# Patient Record
Sex: Male | Born: 1995 | Race: White | Hispanic: No | Marital: Married | Smoking: Current every day smoker
Health system: Southern US, Community
[De-identification: ages and names within clinical notes are randomized; demographics above are authoritative.]

## PROBLEM LIST (undated history)

## (undated) DIAGNOSIS — G723 Periodic paralysis: Secondary | ICD-10-CM

---

## 2017-08-30 ENCOUNTER — Other Ambulatory Visit: Payer: Self-pay

## 2017-08-30 ENCOUNTER — Emergency Department (HOSPITAL_BASED_OUTPATIENT_CLINIC_OR_DEPARTMENT_OTHER): Payer: 59

## 2017-08-30 ENCOUNTER — Encounter (HOSPITAL_BASED_OUTPATIENT_CLINIC_OR_DEPARTMENT_OTHER): Payer: Self-pay

## 2017-08-30 ENCOUNTER — Emergency Department (HOSPITAL_BASED_OUTPATIENT_CLINIC_OR_DEPARTMENT_OTHER)
Admission: EM | Admit: 2017-08-30 | Discharge: 2017-08-30 | Disposition: A | Discharge status: Home / Self Care | Payer: 59 | Attending: Emergency Medicine | Admitting: Emergency Medicine

## 2017-08-30 DIAGNOSIS — R9431 Abnormal electrocardiogram [ECG] [EKG]: Secondary | ICD-10-CM

## 2017-08-30 DIAGNOSIS — E876 Hypokalemia: Secondary | ICD-10-CM | POA: Diagnosis not present

## 2017-08-30 DIAGNOSIS — F17290 Nicotine dependence, other tobacco product, uncomplicated: Secondary | ICD-10-CM | POA: Diagnosis not present

## 2017-08-30 DIAGNOSIS — G723 Periodic paralysis: Secondary | ICD-10-CM | POA: Insufficient documentation

## 2017-08-30 DIAGNOSIS — I493 Ventricular premature depolarization: Secondary | ICD-10-CM

## 2017-08-30 DIAGNOSIS — R002 Palpitations: Secondary | ICD-10-CM

## 2017-08-30 DIAGNOSIS — Z720 Tobacco use: Secondary | ICD-10-CM

## 2017-08-30 DIAGNOSIS — R0789 Other chest pain: Secondary | ICD-10-CM | POA: Diagnosis not present

## 2017-08-30 HISTORY — DX: Periodic paralysis: G72.3

## 2017-08-30 LAB — CBC
HCT: 44 % (ref 39.0–52.0)
Hemoglobin: 15.8 g/dL (ref 13.0–17.0)
MCH: 32.2 pg (ref 26.0–34.0)
MCHC: 35.9 g/dL (ref 30.0–36.0)
MCV: 89.6 fL (ref 78.0–100.0)
Platelets: 232 10*3/uL (ref 150–400)
RBC: 4.91 MIL/uL (ref 4.22–5.81)
RDW: 12.5 % (ref 11.5–15.5)
WBC: 10.2 10*3/uL (ref 4.0–10.5)

## 2017-08-30 LAB — BASIC METABOLIC PANEL
ANION GAP: 7 (ref 5–15)
BUN: 14 mg/dL (ref 6–20)
CALCIUM: 9.6 mg/dL (ref 8.9–10.3)
CHLORIDE: 106 mmol/L (ref 98–111)
CO2: 27 mmol/L (ref 22–32)
Creatinine, Ser: 0.85 mg/dL (ref 0.61–1.24)
GFR calc Af Amer: >60 mL/min (ref >60)
Glucose, Bld: 109 mg/dL — ABNORMAL HIGH (ref 70–99)
POTASSIUM: 3.2 mmol/L — AB (ref 3.5–5.1)
SODIUM: 140 mmol/L (ref 135–145)

## 2017-08-30 LAB — TSH: TSH: 1.07 u[IU]/mL (ref 0.350–4.500)

## 2017-08-30 LAB — MAGNESIUM: Magnesium: 1.9 mg/dL (ref 1.7–2.4)

## 2017-08-30 LAB — TROPONIN I: Troponin I: <0.03 ng/mL (ref <0.03)

## 2017-08-30 LAB — T4, FREE: Free T4: 0.86 ng/dL (ref 0.82–1.77)

## 2017-08-30 MED ORDER — POTASSIUM CHLORIDE CRYS ER 20 MEQ PO TBCR
60.0000 meq | EXTENDED_RELEASE_TABLET | Freq: Once | ORAL | Status: AC
  Administered 2017-08-30: 60 meq via ORAL
  Filled 2017-08-30: qty 3

## 2017-08-30 NOTE — Discharge Instructions (Addendum)
STOP VAPING! Avoid caffeine intake. Avoid doing any activities that make your symptoms worse, but you are allowed to go about your regular activities as long as they don't trigger your symptoms or make you feel bad/worse. Your potassium was low, which is expected with your condition; see the list of foods below to add in your diet to help supplement your diet with potassium. Stay very well hydrated and get plenty of rest. You MUST find a primary care doctor as soon as possible, for ongoing medical care and to potentially get a holter monitor study done in the meantime until you can see the cardiologist. Follow up with the cardiologist listed above in the next few weeks (as soon as you can get an appointment) for ongoing cardiac evaluation and care. [facility] for emergent changes or worsening symptoms.

## 2017-08-30 NOTE — ED Triage Notes (Signed)
C/o chest tightness started last night-NAD-steady gait 

## 2017-08-30 NOTE — ED Provider Notes (Signed)
[facility] EMERGENCY DEPARTMENT Provider Note   CSN: [id number] Arrival date & time: 08/30/17  1259     History   Chief Complaint Chief Complaint  Patient presents with  . Chest Pain    HPI Brian Barnes is a 22 y.o. male with a PMHx of hypokalemic periodic paralysis/Andersen-Tawil Syndrome, who presents to the ED with complaints of intermittent chest tightness, fatigue, and palpitations for the last 1-2 days.  Patient states that when he is resting and not distracted he notices that his heart feels like it is skipping around, does not feel like it is too fast just feels like in a regular beat.  He has checked it and his heart rate will vary anywhere from 40-100, never anything over 100 while he's checking it.  He is also noticed that he has chest tightness during these episodes, and will feel fatigued.  He denies chest pain, just states it feels like a tightness.  He states that being at rest and not distracted seems to make his symptoms worse, and he wonders whether it is "all mental" due to the fact that he is "paying attention to it".  He states that being distracted and doing something such as standing or doing activities seems to improve his symptoms, but again he thinks it is because he is distracted and not paying attention to it.  No other known aggravating or alleviating factors.  He states that occasionally he will feel "a little bit" lightheaded but states that it is "not much" and feels "less lightheaded than when you get up very fast".  He also states that occasionally he will feel "a little bit" short of breath but again he states that this is "not much".  Due to his unique condition, Andersen-Tawil syndrome, he has been remotely evaluated by a cardiologist in the past, can't recall the name of the doctor but states [facility] and it was about 5624yrs ago; hasn't seen a cardiologist since then.  States that at the time he was told his EKG looked "good and normal".  He  does not have a PCP at this time.  His siblings both have the same condition he has, and his brother has "some heart stuff related to that", however he denies any known FHx of MI/CAD or other cardiac disease.  He admits to using E-cigarettes which contain nicotine.  He denies having any other medical problems that he's aware of.   He denies diaphoresis, fevers, chills, cough, LE swelling, recent travel/surgery/immobilization, personal/family hx of DVT/PE, abd pain, N/V/D/C, hematuria, dysuria, myalgias, arthralgias, claudication, orthopnea, numbness, tingling, focal weakness, or any other complaints at this time.  The history is provided by the patient and medical records. No language interpreter was used.  Chest Pain   Associated symptoms include palpitations and shortness of breath ("a little bit sometimes but not much"). Pertinent negatives include no abdominal pain, no cough, no diaphoresis, no fever, no nausea, no numbness, no vomiting and no weakness.    Past Medical History:  Diagnosis Date  . Hypokalemic periodic paralysis     There are no active problems to display for this patient.   History reviewed. No pertinent surgical history.      Home Medications    Prior to Admission medications   Not on File    Family History No family history on file.  Social History Social History   Tobacco Use  . Smoking status: Current Every Day Smoker    Types: E-cigarettes  .  Smokeless tobacco: Former Engineer, water Use Topics  . Alcohol use: Not Currently  . Drug use: Yes    Types: Marijuana     Allergies   Patient has no known allergies.   Review of Systems Review of Systems  Constitutional: Negative for chills, diaphoresis and fever.  Respiratory: Positive for chest tightness and shortness of breath ("a little bit sometimes but not much"). Negative for cough.   Cardiovascular: Positive for chest pain (tightness) and palpitations. Negative for leg swelling.    Gastrointestinal: Negative for abdominal pain, constipation, diarrhea, nausea and vomiting.  Genitourinary: Negative for dysuria and hematuria.  Musculoskeletal: Negative for arthralgias and myalgias.  Skin: Negative for color change.  Allergic/Immunologic: Negative for immunocompromised state.  Neurological: Positive for light-headedness ("a little bit sometimes but not much"). Negative for weakness and numbness.  Psychiatric/Behavioral: Negative for confusion.   All other systems reviewed and are negative for acute change except as noted in the HPI.    Physical Exam Updated Vital Signs BP 130/75   Pulse 79   Temp 98.4 F (36.9 C) (Oral)   Resp 16   Ht 6' (1.829 m)   Wt 80.7 kg (178 lb)   SpO2 100%   BMI 24.14 kg/m    Physical Exam  Constitutional: He is oriented to person, place, and time. Vital signs are normal. He appears well-developed and well-nourished.  Non-toxic appearance. No distress.  Afebrile, nontoxic, NAD  HENT:  Head: Normocephalic and atraumatic.  Mouth/Throat: Oropharynx is clear and moist and mucous membranes are normal.  Eyes: Conjunctivae and EOM are normal. Right eye exhibits no discharge. Left eye exhibits no discharge.  Neck: Normal range of motion. Neck supple.  Cardiovascular: Normal rate, regular rhythm, normal heart sounds and intact distal pulses. Exam reveals no gallop and no friction rub.  No murmur heard. During exam, RRR, nl s1/s2, no m/r/g; on the monitor, he's in NSR for the majority of the evaluation however at one point he has a few PVCs for about 30 seconds and then goes back to NSR. Distal pulses intact, no pedal edema   Pulmonary/Chest: Effort normal and breath sounds normal. No respiratory distress. He has no decreased breath sounds. He has no wheezes. He has no rhonchi. He has no rales. He exhibits no tenderness, no crepitus, no deformity and no retraction.  CTAB in all lung fields, no w/r/r, no hypoxia or increased WOB, speaking in full  sentences, SpO2 100% on RA  Chest wall nonTTP without crepitus, deformities, or retractions   Abdominal: Soft. Normal appearance and bowel sounds are normal. He exhibits no distension. There is no tenderness. There is no rigidity, no rebound, no guarding, no CVA tenderness, no tenderness at McBurney's point and negative Murphy's sign.  Musculoskeletal: Normal range of motion.  MAE x4 Strength and sensation grossly intact in all extremities Distal pulses intact Gait steady No pedal edema, neg homan's bilaterally   Neurological: He is alert and oriented to person, place, and time. He has normal strength. No sensory deficit.  Skin: Skin is warm, dry and intact. No rash noted.  Psychiatric: He has a normal mood and affect.  Nursing note and vitals reviewed.    ED Treatments / Results  Labs (all labs ordered are listed, but only abnormal results are displayed) Labs Reviewed  BASIC METABOLIC PANEL - Abnormal; Notable for the following components:      Result Value   Potassium 3.2 (*)    Glucose, Bld 109 (*)    All  other components within normal limits  CBC  TROPONIN I  MAGNESIUM  TSH  T4, FREE    EKG EKG Interpretation  Date/Time:  Tuesday August 30 2017 13:48:16 EDT Ventricular Rate:  80 PR Interval:  122 QRS Duration: 130 QT Interval:  385 QTC Calculation: 445 R Axis:   61 Text Interpretation:  Sinus rhythm Borderline short PR interval Right bundle branch block Confirmed by Tilden Fossa [phone]) on 08/30/2017 2:07:55 PM  EKG Interpretation- INITIAL EKG Date/Time:  Tuesday August 30 2017 13:09:38 EDT Ventricular Rate:  119 PR Interval:  122 QRS Duration: 118 QT Interval:  392 QTC Calculation: 551 R Axis:   71 Text Interpretation:  Critical Test Result: Long QTc Sinus bradycardia multiform PVC Incomplete right bundle branch block Borderline ECG Confirmed by Tilden Fossa [phone]) on 08/30/2017 2:07:16 PM  Radiology Dg Chest 2 View  Result Date: 08/30/2017 CLINICAL DATA:   Chest tightness since last night. EXAM: CHEST - 2 VIEW COMPARISON:  None. FINDINGS: Lungs clear. Heart size normal. No pneumothorax or pleural fluid. No bony abnormality. IMPRESSION: Normal chest. Electronically Signed   By: Drusilla Kanner M.D.   On: 08/30/2017 13:25    Procedures Procedures (including critical care time)  Medications Ordered in ED Medications  potassium chloride SA (K-DUR,KLOR-CON) CR tablet 60 mEq (60 mEq Oral Given 08/30/17 1605)     Initial Impression / Assessment and Plan / ED Course  I have reviewed the triage vital signs and the nursing notes.  Pertinent labs & imaging results that were available during my care of the patient were reviewed by me and considered in my medical decision making (see chart for details).     22 y.o. male here with intermittent chest tightness, fatigue, and palpitations x1-2 days; reports some very mild lightheadedness and SOB at times but nothing really significant. On exam, clear lungs, RRR without m/r/g, distal pulses intact symmetrically, no pedal edema, no tachycardia or hypoxia, no chest wall TTP, well appearing. EKG with multiform PVCs, ?sinus brady vs sinus rhythm, long QTc, and incomplete RBBB, however interestingly while in room the monitor showing NSR for several minutes and then will have run of PVCs vs ?bigeminy before going back to NSR again. CXR negative for any acute findings. Doubt PE or dissection, doubt need for D-dimer. Will get labs including Mg level and TSH/T4 and reassess shortly. Discussed case with my attending Dr. Madilyn Hook who agrees with plan.   3:48 PM CBC WNL. BMP with mildly low K 3.2, will replete this here; otherwise remainder of BMP WNL. Mg level WNL. Trop negative. TSH/T4 pending. Pt continues to remain stable, states that currently his symptoms feel improved (says that the saline flush after starting his IV seemed to help).  Will touch base with cardiology to discuss case, given pt's history of Andersen-Tawil  syndrome and his abnormal EKG. Will reassess shortly.   4:04 PM Dr. Cristal Deer of cardiology returning page, reports that his EKG looks like bigeminy/multifocal PVCs; recommends giving potassium, f/up cardiology outpatient soon, doesn't think he needs to be admitted just needs to be seen ASAP; if he's miserable then xfer to ED for eval there, if not then can go home with close f/up with them. Wants him to get PCP ASAP so they can get holter monitor study started, and to have medical care, and then f/up with cardiology in the next few weeks as soon as possible. If symptoms progress then come back immediately for cardiac eval. Continue daily activities, avoid caffeine intake and vaping.  Pt feeling well and agreeable with outpatient evaluation/management, doesn't feel like he needs to go to the ED at this time for urgent evaluation; I think this is reasonable, pt remained stable. Advised staying hydrated, getting plenty of rest, given list of foods with potassium to help supplement diet, Nicotine/vaping cessation strongly encouraged, get PCP ASAP, and f/up with cardiology ASAP. Strict return precautions advised. I explained the diagnosis and have given explicit precautions to return to the ER including for any other new or worsening symptoms. The patient understands and accepts the medical plan as it's been dictated and I have answered their questions. Discharge instructions concerning home care and prescriptions have been given. The patient is STABLE and is discharged to home in good condition.    Final Clinical Impressions(s) / ED Diagnoses   Final diagnoses:  Chest tightness  Abnormal EKG  PVC's (premature ventricular contractions)  Palpitations  Current nicotine use  Hypokalemia    ED Discharge Orders    [address] [place], [place] 08/30/17 1626    Tilden Fossa, MD 08/31/17 [phone]

## 2017-09-22 ENCOUNTER — Ambulatory Visit: Payer: PRIVATE HEALTH INSURANCE | Admitting: Cardiology

## 2018-12-05 IMAGING — DX DG CHEST 2V
2 series · 2 of 2 positions shown · non-contrast
Comparison: None.

CLINICAL DATA: Chest tightness since last night.

EXAM:
CHEST - 2 VIEW

[chest pa]
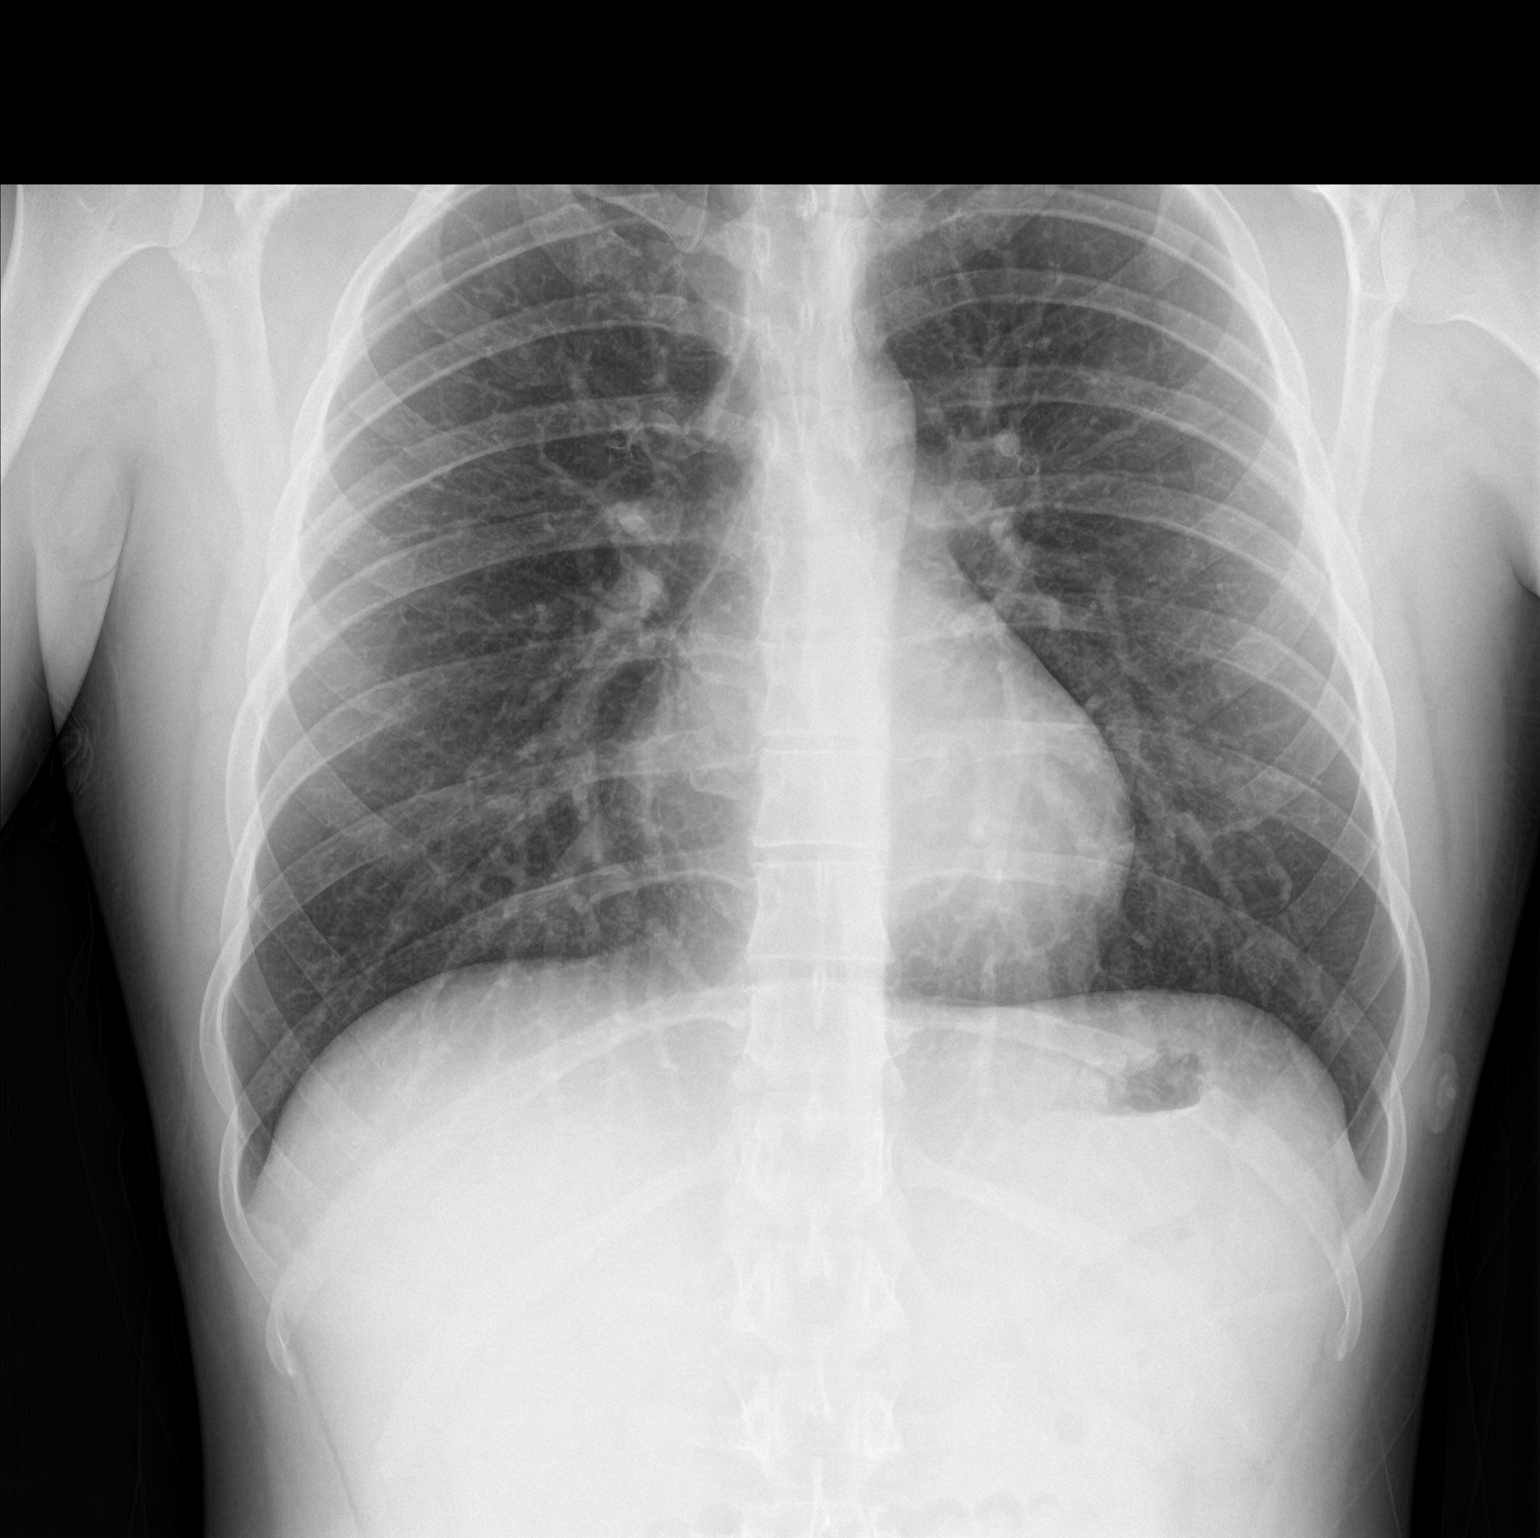

[chest lat]
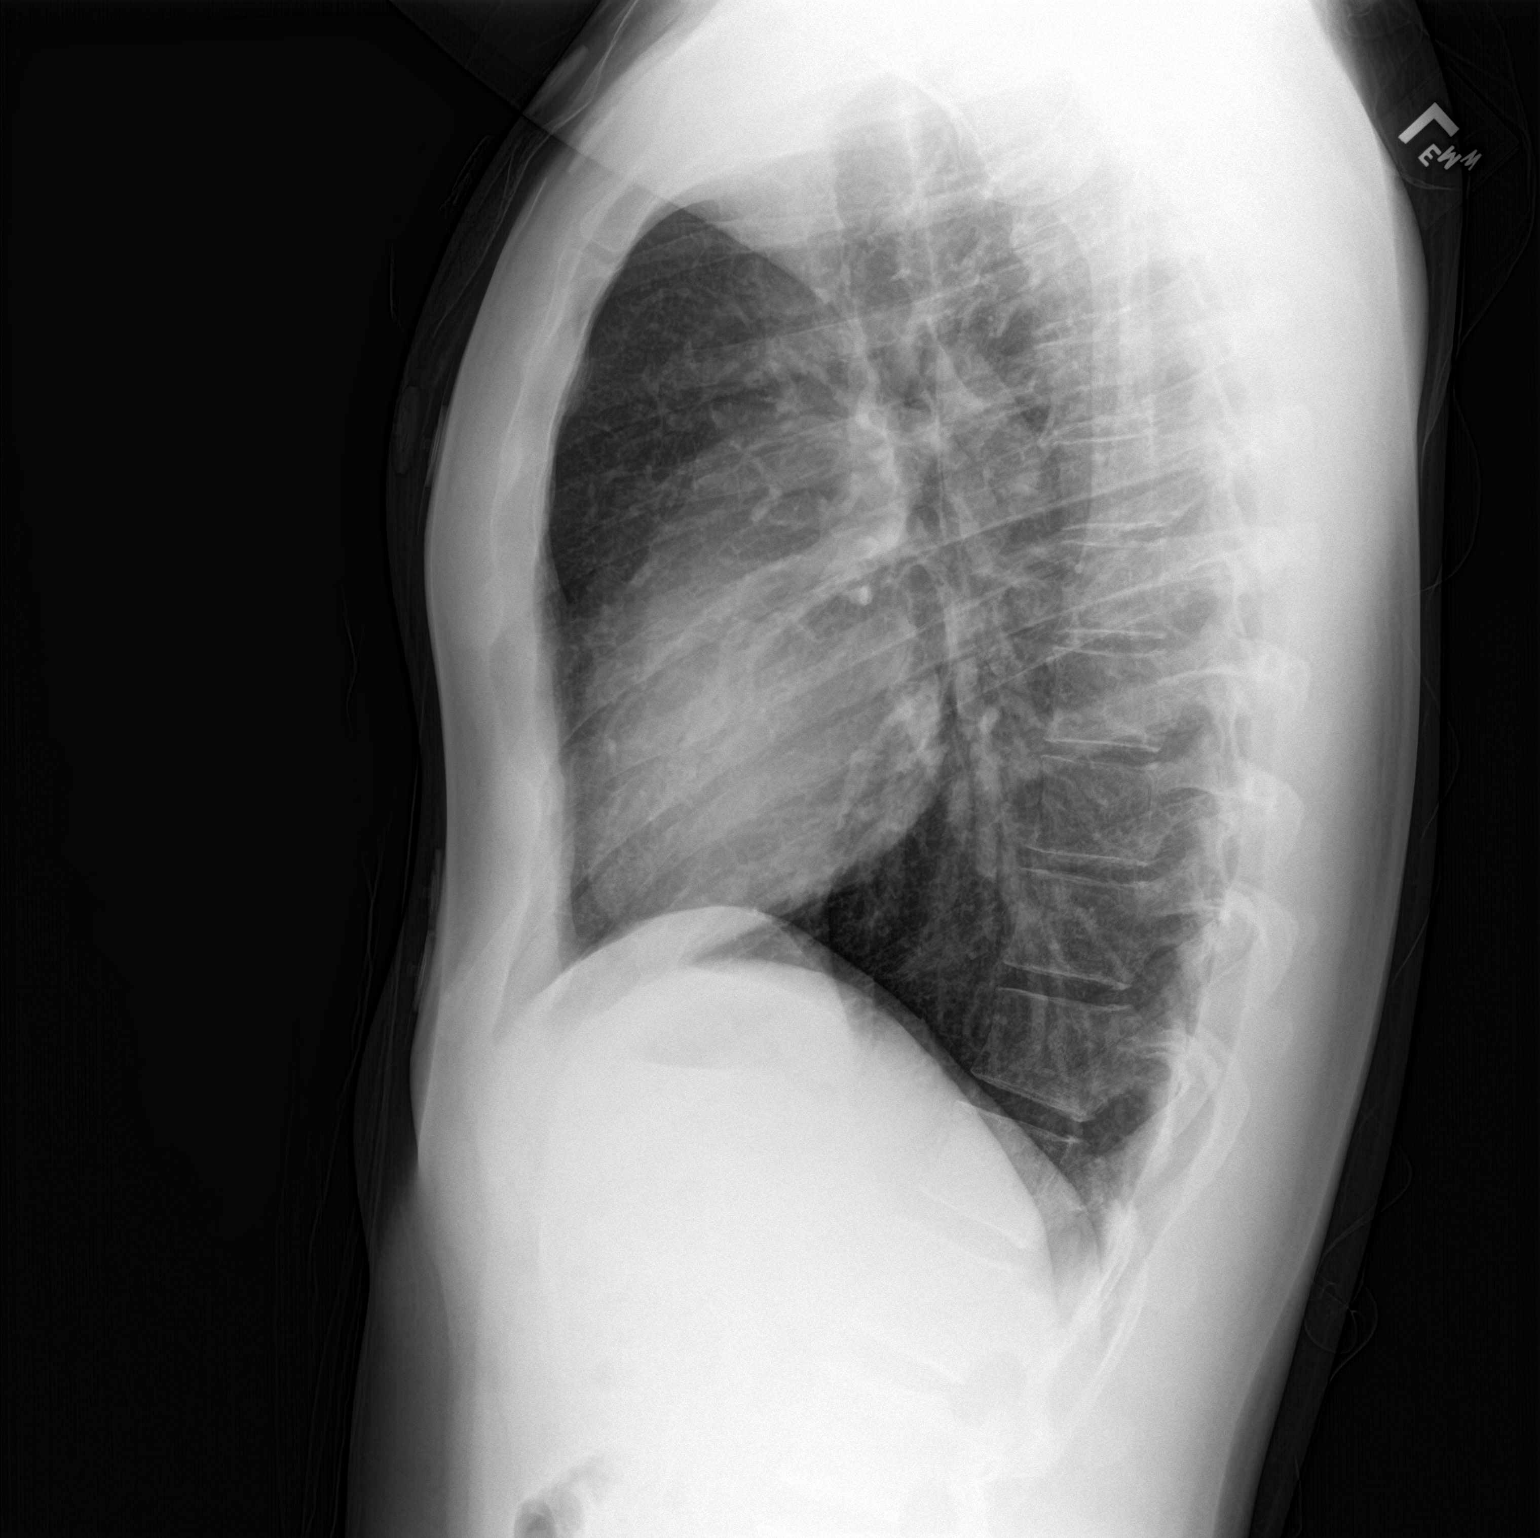

[2 of 2 positions shown; findings below may reference images not displayed]

FINDINGS: Lungs clear. Heart size normal. No pneumothorax or pleural fluid. No
bony abnormality.
IMPRESSION: Normal chest.
